# Patient Record
Sex: Female | Born: 1970 | Race: White | Smoking: Light tobacco smoker
Health system: Southern US, Community
[De-identification: ages and names within clinical notes are randomized; demographics above are authoritative.]

## PROBLEM LIST (undated history)

## (undated) DIAGNOSIS — E7800 Pure hypercholesterolemia, unspecified: Secondary | ICD-10-CM

## (undated) HISTORY — PX: TONSILLECTOMY: SUR1361

## (undated) HISTORY — PX: TUBAL LIGATION: SHX77

---

## 2018-07-01 ENCOUNTER — Other Ambulatory Visit: Payer: Self-pay | Admitting: Student

## 2018-07-01 DIAGNOSIS — M25512 Pain in left shoulder: Principal | ICD-10-CM

## 2018-07-01 DIAGNOSIS — G8929 Other chronic pain: Secondary | ICD-10-CM

## 2018-07-15 ENCOUNTER — Ambulatory Visit
Admission: RE | Admit: 2018-07-15 | Discharge: 2018-07-15 | Disposition: A | Discharge status: Home / Self Care | Payer: BLUE CROSS/BLUE SHIELD | Source: Ambulatory Visit | Attending: Student | Admitting: Student

## 2018-07-15 DIAGNOSIS — G8929 Other chronic pain: Secondary | ICD-10-CM

## 2018-07-15 DIAGNOSIS — M25512 Pain in left shoulder: Principal | ICD-10-CM

## 2018-07-15 MED ORDER — IOPAMIDOL (ISOVUE-M 200) INJECTION 41%
10.0000 mL | Freq: Once | INTRAMUSCULAR | Status: AC
  Administered 2018-07-15: 12 mL via INTRA_ARTICULAR

## 2019-02-19 ENCOUNTER — Emergency Department (INDEPENDENT_AMBULATORY_CARE_PROVIDER_SITE_OTHER)
Admission: EM | Admit: 2019-02-19 | Discharge: 2019-02-19 | Disposition: A | Discharge status: Home / Self Care | Payer: BLUE CROSS/BLUE SHIELD | Source: Home / Self Care

## 2019-02-19 ENCOUNTER — Other Ambulatory Visit: Payer: Self-pay

## 2019-02-19 DIAGNOSIS — Z20822 Contact with and (suspected) exposure to covid-19: Secondary | ICD-10-CM

## 2019-02-19 DIAGNOSIS — R6889 Other general symptoms and signs: Secondary | ICD-10-CM | POA: Diagnosis not present

## 2019-02-19 DIAGNOSIS — R05 Cough: Secondary | ICD-10-CM | POA: Diagnosis not present

## 2019-02-19 DIAGNOSIS — R059 Cough, unspecified: Secondary | ICD-10-CM

## 2019-02-19 HISTORY — DX: Pure hypercholesterolemia, unspecified: E78.00

## 2019-02-19 LAB — POCT INFLUENZA A/B
Influenza A, POC: NEGATIVE
Influenza B, POC: NEGATIVE

## 2019-02-19 MED ORDER — ONDANSETRON 4 MG PO TBDP
4.0000 mg | ORAL_TABLET | Freq: Once | ORAL | Status: AC
  Administered 2019-02-19: 4 mg via ORAL

## 2019-02-19 MED ORDER — IBUPROFEN 600 MG PO TABS
600.0000 mg | ORAL_TABLET | Freq: Once | ORAL | Status: AC
  Administered 2019-02-19: 600 mg via ORAL

## 2019-02-19 NOTE — Discharge Instructions (Signed)
°  You may take 500mg  acetaminophen every 4-6 hours or in combination with ibuprofen 400-600mg  every 6-8 hours as needed for pain, inflammation, and fever.  Be sure to well hydrated with clear liquids and get at least 8 hours of sleep at night, preferably more while sick.   Please follow up with family medicine in 1 week if needed.  Due to concern for possibly having Covid-19, it is advised that you self-isolate at home until test results come back.  If positive, it is recommended you stay isolated for at least 10 days after symptom onset and 24 hours after last fever without taking medication (whichever is longer).  If you MUST go out, please wear a mask at all times, limit contact with others. Wash hands frequently.  Call 911 or have someone drive you to the hospital if symptoms worsening- difficulty breathing, unable to keep down fluids, passing out, or other new concerning symptoms develop.

## 2019-02-19 NOTE — ED Provider Notes (Signed)
[facility]    CSN: [id number] Arrival date & time: 02/19/19  1330     History   Chief Complaint Chief Complaint  Patient presents with  . Cough  . Nausea  . Generalized Body Aches    HPI Debra Santos is a 48 y.o. female.   HPI Debra Santos is a 48 y.o. female presenting to UC with c/o gradually worsening flu-like symptoms that started out as a sinus headache on Wednesday, 02/15/2019.  She has since developed body aches, chills, mildly productive cough, chest and back aches. Denies SOB. She also reports moving once this morning, bilateral ear fullness. No known sick contacts. She did travel to [place] on Wednesday but states she was feeling bad on the drive down.  She has tried OTC Sudafed, Tylenol, and Benadryl w/o relief.    Past Medical History:  Diagnosis Date  . High cholesterol     There are no active problems to display for this patient.   Past Surgical History:  Procedure Laterality Date  . TONSILLECTOMY    . TUBAL LIGATION      OB History   No obstetric history on file.      Home Medications    Prior to Admission medications   Medication Sig Start Date End Date Taking? Authorizing Provider  topiramate (TOPAMAX) 25 MG tablet Take by mouth. 02/07/18  Yes [provider]  levonorgestrel (MIRENA) 20 MCG/24HR IUD by Intrauterine route.    [provider]  linaclotide Karlene Einstein(LINZESS) 72 MCG capsule Take by mouth.    [provider]  pravastatin (PRAVACHOL) 40 MG tablet Take 40 mg by mouth daily. 02/09/19   [provider]    Family History History reviewed. No pertinent family history.  Social History Social History   Tobacco Use  . Smoking status: Light Tobacco Smoker  . Smokeless tobacco: Never Used  . Tobacco comment: socially when drinking  Substance Use Topics  . Alcohol use: Yes    Comment: very seldom  . Drug use: Not Currently     Allergies   Patient has no known allergies.    Review of Systems Review of Systems  Constitutional: Positive for appetite change, chills and fatigue. Negative for diaphoresis and fever.  HENT: Positive for congestion, ear pain and sinus pressure. Negative for sore throat, trouble swallowing and voice change.   Respiratory: Positive for cough. Negative for shortness of breath.   Cardiovascular: Negative for chest pain and palpitations.  Gastrointestinal: Positive for nausea and vomiting. Negative for abdominal pain and diarrhea.  Musculoskeletal: Positive for arthralgias, back pain and myalgias. Negative for neck pain and neck stiffness.  Skin: Negative for rash.  Neurological: Positive for headaches. Negative for dizziness and light-headedness.     Physical Exam Triage Vital Signs ED Triage Vitals  Enc Vitals Group     BP 02/19/19 1349 122/78     Pulse Rate 02/19/19 1349 84     Resp 02/19/19 1349 18     Temp 02/19/19 1349 98.5 F (36.9 C)     Temp Source 02/19/19 1349 Oral     SpO2 02/19/19 1349 99 %     Weight 02/19/19 1352 156 lb (70.8 kg)     Height 02/19/19 1352 5\' 4"  (1.626 m)     Head Circumference --      Peak Flow --      Pain Score 02/19/19 1352 10     Pain Loc --      Pain Edu? --  Excl. in GC? --    No data found.  Updated Vital Signs BP 122/78 (BP Location: Right Arm)   Pulse 84   Temp 98.5 F (36.9 C) (Oral)   Resp 18   Ht 5\' 4"  (1.626 m)   Wt 156 lb (70.8 kg)   SpO2 99%   BMI 26.78 kg/m   Visual Acuity Right Eye Distance:   Left Eye Distance:   Bilateral Distance:    Right Eye Near:   Left Eye Near:    Bilateral Near:     Physical Exam Vitals signs and nursing note reviewed.  Constitutional:      Appearance: Normal appearance. She is well-developed. She is ill-appearing.     Comments: Pt lying on exam bed, wrapped in a blanket, appears to have chills. Alert and cooperative during exam but does appear uncomfortable.  HENT:     Head: Normocephalic and atraumatic.     Right Ear:  Tympanic membrane normal.     Left Ear: Tympanic membrane normal.     Nose: Nose normal.     Right Sinus: No maxillary sinus tenderness or frontal sinus tenderness.     Left Sinus: No maxillary sinus tenderness or frontal sinus tenderness.     Mouth/Throat:     Lips: Pink.     Mouth: Mucous membranes are moist.     Pharynx: Oropharynx is clear. Uvula midline.  Neck:     Musculoskeletal: Normal range of motion.  Cardiovascular:     Rate and Rhythm: Normal rate and regular rhythm.  Pulmonary:     Effort: Pulmonary effort is normal. No respiratory distress.     Breath sounds: Normal breath sounds. No stridor. No wheezing, rhonchi or rales.  Abdominal:     General: There is no distension.     Palpations: Abdomen is soft.     Tenderness: There is no abdominal tenderness.  Musculoskeletal: Normal range of motion.  Skin:    General: Skin is warm and dry.  Neurological:     Mental Status: She is alert and oriented to person, place, and time.  Psychiatric:        Behavior: Behavior normal.      UC Treatments / Results  Labs (all labs ordered are listed, but only abnormal results are displayed) Labs Reviewed  NOVEL CORONAVIRUS, NAA  POCT INFLUENZA A/B    EKG None  Radiology No results found.  Procedures Procedures (including critical care time)  Medications Ordered in UC Medications  ibuprofen (ADVIL) tablet 600 mg (600 mg Oral Given 02/19/19 1455)  ondansetron (ZOFRAN-ODT) disintegrating tablet 4 mg (4 mg Oral Given 02/19/19 1455)    Initial Impression / Assessment and Plan / UC Course  I have reviewed the triage vital signs and the nursing notes.  Pertinent labs & imaging results that were available during my care of the patient were reviewed by me and considered in my medical decision making (see chart for details).     Pt c/o Flu-like symptoms for about 5 days. Rapid flu test: NEGATIVE Covid test pending. Discussed high suspicion for Covid and advised pt to  self-isolate. AVS provided. Discussed symptoms that warrant emergent care in the ED.   Final Clinical Impressions(s) / UC Diagnoses   Final diagnoses:  Cough  Flu-like symptoms  Suspected Covid-19 Virus Infection     Discharge Instructions      You may take 500mg  acetaminophen every 4-6 hours or in combination with ibuprofen 400-600mg  every 6-8 hours as needed for pain,  inflammation, and fever.  Be sure to well hydrated with clear liquids and get at least 8 hours of sleep at night, preferably more while sick.   Please follow up with family medicine in 1 week if needed.  Due to concern for possibly having Covid-19, it is advised that you self-isolate at home until test results come back.  If positive, it is recommended you stay isolated for at least 10 days after symptom onset and 24 hours after last fever without taking medication (whichever is longer).  If you MUST go out, please wear a mask at all times, limit contact with others. Wash hands frequently.  Call 911 or have someone drive you to the hospital if symptoms worsening- difficulty breathing, unable to keep down fluids, passing out, or other new concerning symptoms develop.     ED Prescriptions    None     Controlled Substance Prescriptions [place] Controlled Substance Registry consulted? Not Applicable   Tyrell Antonio 02/19/19 1529

## 2019-02-19 NOTE — ED Triage Notes (Signed)
Started Wednesday with sinus headache.  Generalized aching.  Yesterday chills, cough, productive at times.  Chest and back aches.  Poor appetite.  Vomited this am.ear fullness.

## 2019-02-22 ENCOUNTER — Telehealth: Payer: Self-pay | Admitting: *Deleted

## 2019-02-22 LAB — NOVEL CORONAVIRUS, NAA: SARS-CoV-2, NAA: NOT DETECTED

## 2020-05-23 ENCOUNTER — Other Ambulatory Visit: Payer: Self-pay | Admitting: Family Medicine

## 2020-05-23 DIAGNOSIS — I671 Cerebral aneurysm, nonruptured: Secondary | ICD-10-CM

## 2020-05-23 DIAGNOSIS — R42 Dizziness and giddiness: Secondary | ICD-10-CM

## 2020-06-15 ENCOUNTER — Other Ambulatory Visit: Payer: Self-pay

## 2020-06-15 ENCOUNTER — Ambulatory Visit
Admission: RE | Admit: 2020-06-15 | Discharge: 2020-06-15 | Disposition: A | Discharge status: Home / Self Care | Payer: BC Managed Care – PPO | Source: Ambulatory Visit | Attending: Family Medicine | Admitting: Family Medicine

## 2020-06-15 ENCOUNTER — Other Ambulatory Visit: Payer: BLUE CROSS/BLUE SHIELD

## 2020-06-15 DIAGNOSIS — R42 Dizziness and giddiness: Secondary | ICD-10-CM

## 2020-06-15 DIAGNOSIS — I671 Cerebral aneurysm, nonruptured: Secondary | ICD-10-CM

## 2020-06-15 MED ORDER — GADOBENATE DIMEGLUMINE 529 MG/ML IV SOLN
15.0000 mL | Freq: Once | INTRAVENOUS | Status: AC | PRN
  Administered 2020-06-15: 15 mL via INTRAVENOUS

## 2022-07-05 IMAGING — MR MR HEAD WO/W CM
12 series · 48 of 48 positions shown · IV contrast (multihance)
Comparison: None.

CLINICAL DATA: Dizziness, vertigo

EXAM:
MRI HEAD WITHOUT AND WITH CONTRAST
TECHNIQUE: Multiplanar, multiecho pulse sequences of the brain and surrounding
structures were obtained without and with intravenous contrast.
CONTRAST:  15mL MULTIHANCE GADOBENATE DIMEGLUMINE 529 MG/ML IV SOLN

[Series 2: T1 · sagittal · 5.0mm · 0.45mm/px · 2 of 21 slices shown]
[im 1/21]
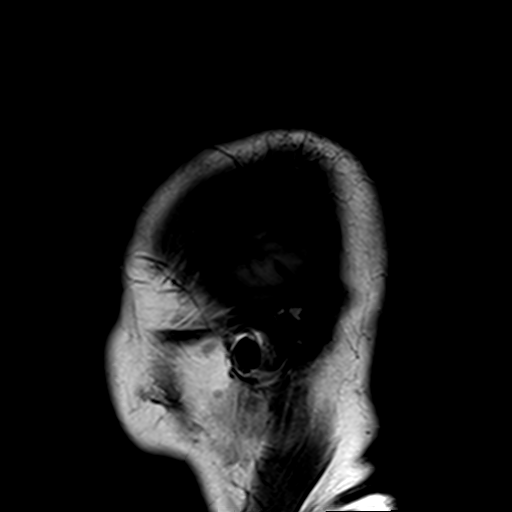
[im 21/21]
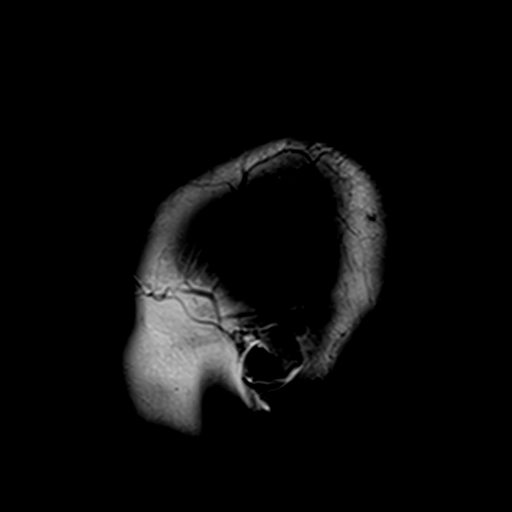

[Series 3: DWI · axial · 3.0mm · 1.80mm/px · z∈[-59,+87]mm · 7 of 100 slices shown (1 of 4)]
[im 1/100]
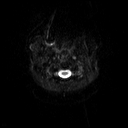
[im 17/100]
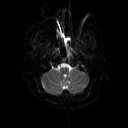
[im 34/100]
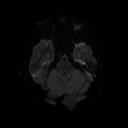
[im 50/100]
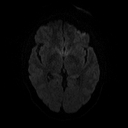
[im 67/100]
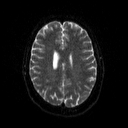
[im 83/100]
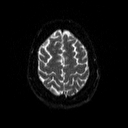
[im 100/100]
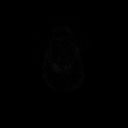

[Series 4: DWI · axial · 3.0mm · 1.80mm/px · z∈[-59,+87]mm · 3 of 50 slices shown (2 of 4)]
[im 1/50]
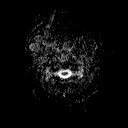
[im 25/50]
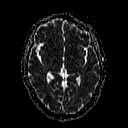
[im 50/50]
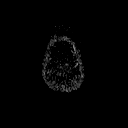

[Series 5: DWI · coronal · 5.0mm · 1.80mm/px · 4 of 65 slices shown (3 of 4)]
[im 1/65]
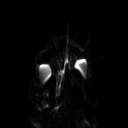
[im 22/65]
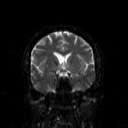
[im 43/65]
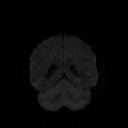
[im 65/65]
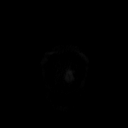

[Series 6: DWI · coronal · 5.0mm · 1.80mm/px · 2 of 34 slices shown (4 of 4)]
[im 1/34]
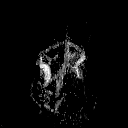
[im 34/34]
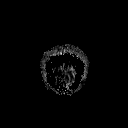

[Series 7: T2 · axial · 5.0mm · 0.60mm/px · z∈[-57,+85]mm · 2 of 22 slices shown (1 of 2)]
[im 1/22]
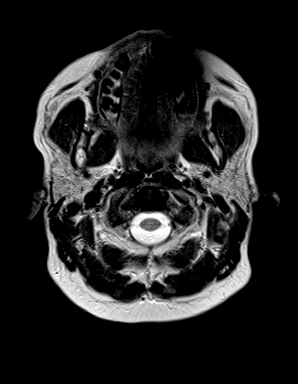
[im 22/22]
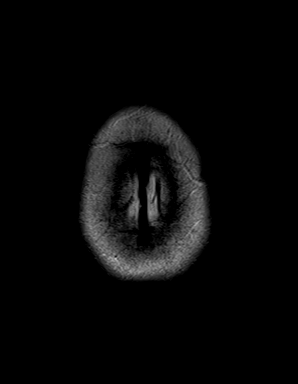

[Series 8: FLAIR · axial · 3.0mm · 0.45mm/px · z∈[-48,+87]mm · 2 of 30 slices shown]
[im 1/30]
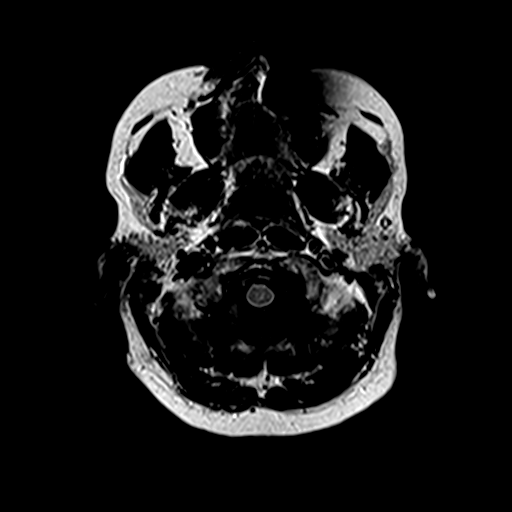
[im 30/30]
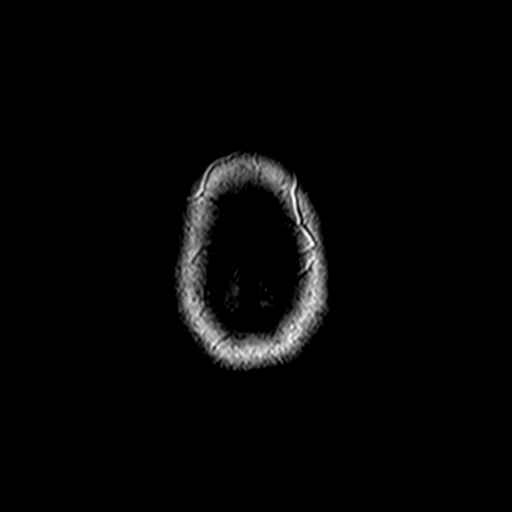

[Series 10: swi_images · axial · 4.0mm · 0.90mm/px · z∈[-56,+84]mm · 2 of 36 slices shown]
[im 1/36]
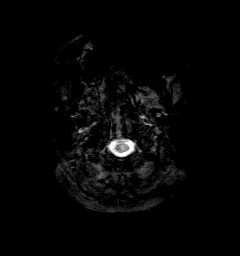
[im 36/36]
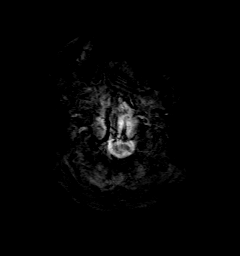

[Series 11: t1_mpr_tra · axial · 1.0mm · 0.75mm/px · z∈[-57,+85]mm · 10 of 144 slices shown (1 of 2)]
[im 1/144]
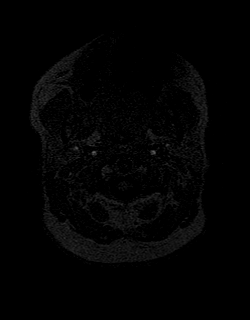
[im 16/144]
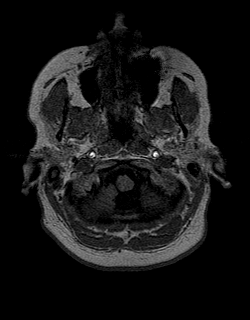
[im 32/144]
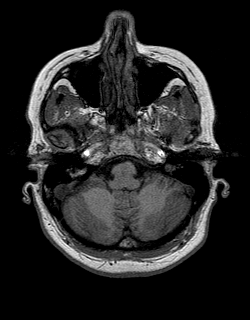
[im 48/144]
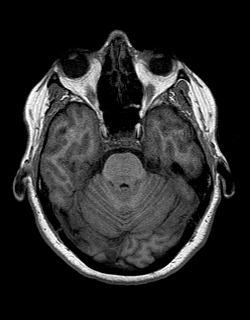
[im 64/144]
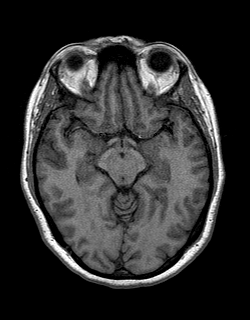
[im 80/144]
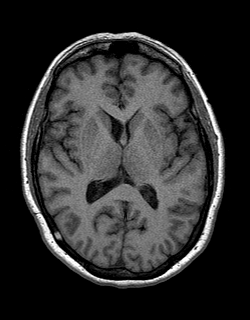
[im 96/144]
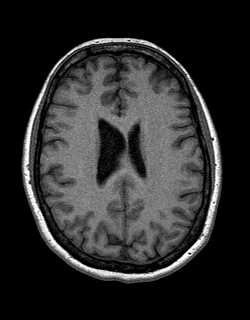
[im 112/144]
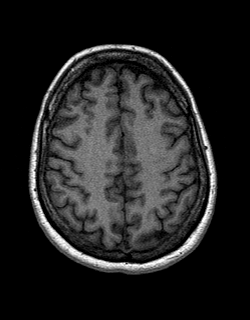
[im 128/144]
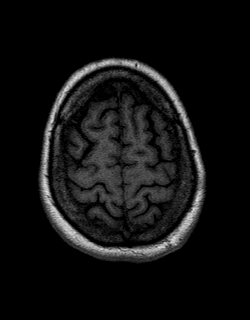
[im 144/144]
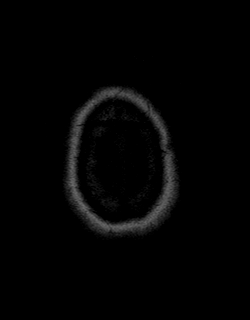

[Series 12: T2 · coronal · 5.0mm · 0.45mm/px · 2 of 25 slices shown (2 of 2)]
[im 1/25]
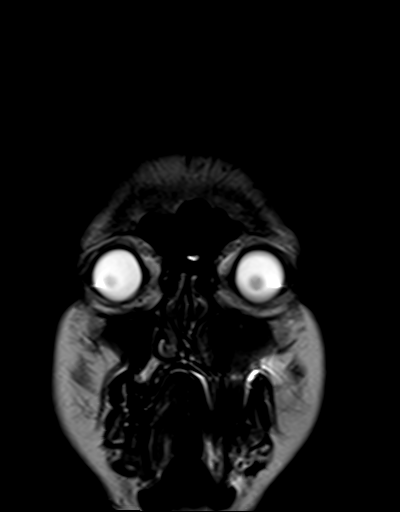
[im 25/25]
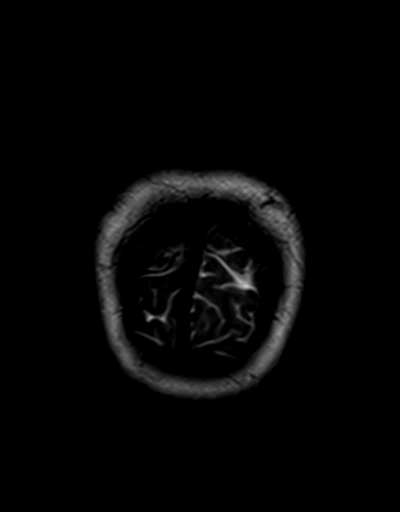

[Series 13: t1_mpr_tra · axial · 1.0mm · 0.75mm/px · z∈[-57,+85]mm · 10 of 144 slices shown (2 of 2)]
[im 1/144]
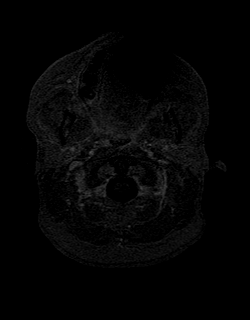
[im 16/144]
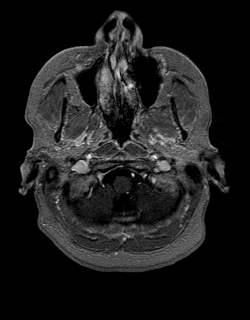
[im 32/144]
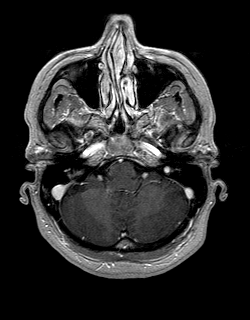
[im 48/144]
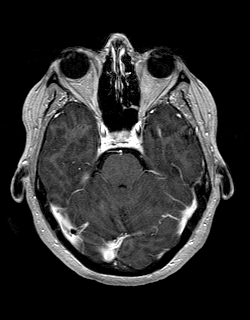
[im 64/144]
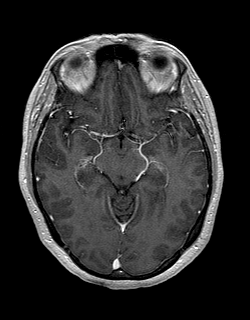
[im 80/144]
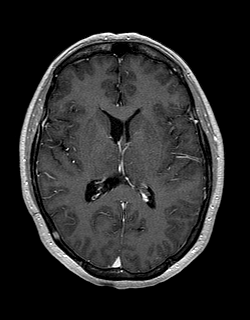
[im 96/144]
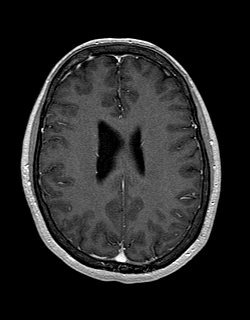
[im 112/144]
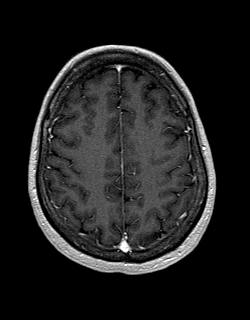
[im 128/144]
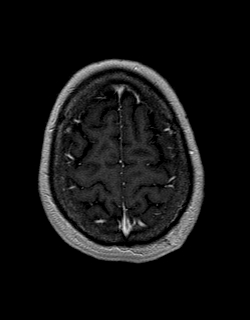
[im 144/144]
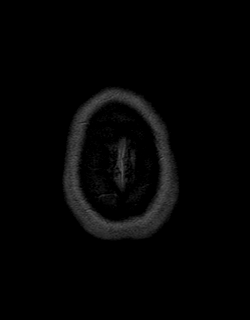

[Series 14: post cor · coronal · 5.0mm · 0.45mm/px · 2 of 25 slices shown]
[im 1/25]
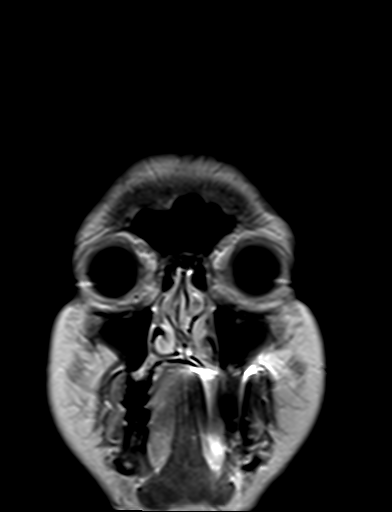
[im 25/25]
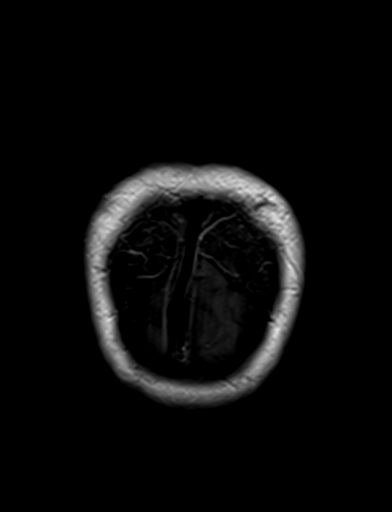

[48 of 48 positions shown; findings below may reference images not displayed]

FINDINGS: Brain: There is no acute infarction or intracranial hemorrhage.
There is no intracranial mass, mass effect, or edema. There is no
hydrocephalus or extra-axial fluid collection. Ventricles and sulci
are within normal limits in size and configuration. Asymmetric
prominence of the right lateral ventricles is likely on a congenital
basis the no abnormal enhancement.

Vascular: Major vessel flow voids at the skull base are preserved.

Skull and upper cervical spine: Normal marrow signal is preserved.

Sinuses/Orbits: Paranasal sinuses are aerated. Orbits are
unremarkable.

Other: Sella is unremarkable.  Mastoid air cells are clear.
IMPRESSION: No acute or significant abnormality.
